# Patient Record
Sex: Female | Born: 1935 | Race: White | Hispanic: No | Marital: Single | Smoking: Never smoker
Health system: Southern US, Community
[De-identification: ages and names within clinical notes are randomized; demographics above are authoritative.]

## PROBLEM LIST (undated history)

## (undated) DIAGNOSIS — G809 Cerebral palsy, unspecified: Secondary | ICD-10-CM

## (undated) DIAGNOSIS — I82409 Acute embolism and thrombosis of unspecified deep veins of unspecified lower extremity: Secondary | ICD-10-CM

## (undated) DIAGNOSIS — G40909 Epilepsy, unspecified, not intractable, without status epilepticus: Secondary | ICD-10-CM

## (undated) DIAGNOSIS — C73 Malignant neoplasm of thyroid gland: Secondary | ICD-10-CM

## (undated) HISTORY — PX: OTHER SURGICAL HISTORY: SHX169

## (undated) HISTORY — PX: THYROIDECTOMY: SHX17

---

## 2003-03-10 ENCOUNTER — Emergency Department (HOSPITAL_COMMUNITY): Admission: EM | Admit: 2003-03-10 | Discharge: 2003-03-11 | Payer: Self-pay | Admitting: Emergency Medicine

## 2011-08-06 ENCOUNTER — Emergency Department (INDEPENDENT_AMBULATORY_CARE_PROVIDER_SITE_OTHER): Payer: Medicare Other

## 2011-08-06 ENCOUNTER — Other Ambulatory Visit (HOSPITAL_BASED_OUTPATIENT_CLINIC_OR_DEPARTMENT_OTHER): Payer: Self-pay

## 2011-08-06 ENCOUNTER — Emergency Department (HOSPITAL_BASED_OUTPATIENT_CLINIC_OR_DEPARTMENT_OTHER)
Admission: EM | Admit: 2011-08-06 | Discharge: 2011-08-06 | Disposition: A | Discharge status: Home / Self Care | Payer: Medicare Other | Attending: Emergency Medicine | Admitting: Emergency Medicine

## 2011-08-06 ENCOUNTER — Encounter (HOSPITAL_BASED_OUTPATIENT_CLINIC_OR_DEPARTMENT_OTHER): Payer: Self-pay | Admitting: *Deleted

## 2011-08-06 DIAGNOSIS — G809 Cerebral palsy, unspecified: Secondary | ICD-10-CM | POA: Insufficient documentation

## 2011-08-06 DIAGNOSIS — M542 Cervicalgia: Secondary | ICD-10-CM

## 2011-08-06 DIAGNOSIS — Z86718 Personal history of other venous thrombosis and embolism: Secondary | ICD-10-CM | POA: Insufficient documentation

## 2011-08-06 DIAGNOSIS — W19XXXA Unspecified fall, initial encounter: Secondary | ICD-10-CM

## 2011-08-06 DIAGNOSIS — G40909 Epilepsy, unspecified, not intractable, without status epilepticus: Secondary | ICD-10-CM | POA: Insufficient documentation

## 2011-08-06 DIAGNOSIS — S0100XA Unspecified open wound of scalp, initial encounter: Secondary | ICD-10-CM | POA: Insufficient documentation

## 2011-08-06 DIAGNOSIS — S0101XA Laceration without foreign body of scalp, initial encounter: Secondary | ICD-10-CM

## 2011-08-06 DIAGNOSIS — W010XXA Fall on same level from slipping, tripping and stumbling without subsequent striking against object, initial encounter: Secondary | ICD-10-CM | POA: Insufficient documentation

## 2011-08-06 DIAGNOSIS — R51 Headache: Secondary | ICD-10-CM

## 2011-08-06 DIAGNOSIS — Z85850 Personal history of malignant neoplasm of thyroid: Secondary | ICD-10-CM | POA: Insufficient documentation

## 2011-08-06 DIAGNOSIS — S0990XA Unspecified injury of head, initial encounter: Secondary | ICD-10-CM

## 2011-08-06 HISTORY — DX: Epilepsy, unspecified, not intractable, without status epilepticus: G40.909

## 2011-08-06 HISTORY — DX: Cerebral palsy, unspecified: G80.9

## 2011-08-06 HISTORY — DX: Acute embolism and thrombosis of unspecified deep veins of unspecified lower extremity: I82.409

## 2011-08-06 HISTORY — DX: Malignant neoplasm of thyroid gland: C73

## 2011-08-06 NOTE — ED Provider Notes (Signed)
Medical screening examination/treatment/procedure(s) were conducted as a shared visit with non-physician practitioner(s) and myself.  I personally evaluated the patient during the encounter  See my prior note.  Nat Christen, MD 08/06/11 [phone]

## 2011-08-06 NOTE — ED Notes (Signed)
Pt brought in by family members. States patient was standing up, using her walker, when she lost her balance and fell backwards, striking her head against tiled floor. Pt has laceration to left scalp. Bleeding at this time. Pt is on blood thinners. No loss of consciousness. Pt alert and oriented upon arrival.

## 2011-08-06 NOTE — Discharge Instructions (Signed)
Cervical Sprain A cervical sprain is an injury in the neck in which the ligaments are stretched or torn. The ligaments are the tissues that hold the bones of the neck (vertebrae) in place.Cervical sprains can range from very mild to very severe. Most cervical sprains get better in 1 to 3 weeks, but it depends on the cause and extent of the injury. Severe cervical sprains can cause the neck vertebrae to be unstable. This can lead to damage of the spinal cord and can result in serious nervous system problems. Your caregiver will determine whether your cervical sprain is mild or severe. CAUSES  Severe cervical sprains may be caused by:  Contact sport injuries (football, rugby, wrestling, hockey, auto racing, gymnastics, diving, martial arts, boxing).   Motor vehicle collisions.   Whiplash injuries. This means the neck is forcefully whipped backward and forward.   Falls.  Mild cervical sprains may be caused by:   Awkward positions, such as cradling a telephone between your ear and shoulder.   Sitting in a chair that does not offer proper support.   Working at a poorly designed computer station.   Activities that require looking up or down for long periods of time.  SYMPTOMS   Pain, soreness, stiffness, or a burning sensation in the front, back, or sides of the neck. This discomfort may develop immediately after injury or it may develop slowly and not begin for 24 hours or more after an injury.   Pain or tenderness directly in the middle of the back of the neck.   Shoulder or upper back pain.   Limited ability to move the neck.   Headache.   Dizziness.   Weakness, numbness, or tingling in the hands or arms.   Muscle spasms.   Difficulty swallowing or chewing.   Tenderness and swelling of the neck.  DIAGNOSIS  Most of the time, your caregiver can diagnose this problem by taking your history and doing a physical exam. Your caregiver will ask about any known problems, such as  arthritis in the neck or a previous neck injury. X-rays may be taken to find out if there are any other problems, such as problems with the bones of the neck. However, an X-ray often does not reveal the full extent of a cervical sprain. Other tests such as a computed tomography (CT) scan or magnetic resonance imaging (MRI) may be needed. TREATMENT  Treatment depends on the severity of the cervical sprain. Mild sprains can be treated with rest, keeping the neck in place (immobilization), and pain medicines. Severe cervical sprains need immediate immobilization and an appointment with an orthopedist or neurosurgeon. Several treatment options are available to help with pain, muscle spasms, and other symptoms. Your caregiver may prescribe:  Medicines, such as pain relievers, numbing medicines, or muscle relaxants.   Physical therapy. This can include stretching exercises, strengthening exercises, and posture training. Exercises and improved posture can help stabilize the neck, strengthen muscles, and help stop symptoms from returning.   A neck collar to be worn for short periods of time. Often, these collars are worn for comfort. However, certain collars may be worn to protect the neck and prevent further worsening of a serious cervical sprain.  HOME CARE INSTRUCTIONS   Put ice on the injured area.   Put ice in a plastic bag.   Place a towel between your skin and the bag.   Leave the ice on for 15 to 20 minutes, 3 to 4 times a day.     Only take over-the-counter or prescription medicines for pain, discomfort, or fever as directed by your caregiver.   Keep all follow-up appointments as directed by your caregiver.   Keep all physical therapy appointments as directed by your caregiver.   If a neck collar is prescribed, wear it as directed by your caregiver.   Do not drive while wearing a neck collar.   Make any needed adjustments to your work station to promote good posture.   Avoid positions  and activities that make your symptoms worse.   Warm up and stretch before being active to help prevent problems.  SEEK MEDICAL CARE IF:   Your pain is not controlled with medicine.   You are unable to decrease your pain medicine over time as planned.   Your activity level is not improving as expected.  SEEK IMMEDIATE MEDICAL CARE IF:   You develop any bleeding, stomach upset, or signs of an allergic reaction to your medicine.   Your symptoms get worse.   You develop new, unexplained symptoms.   You have numbness, tingling, weakness, or paralysis in any part of your body.  MAKE SURE YOU:   Understand these instructions.   Will watch your condition.   Will get help right away if you are not doing well or get worse.  Document Released: 02/15/2007 Document Revised: 04/09/2011 Document Reviewed: 01/21/2011 [facility] Patient Information 2012 [place], [place].Head Injury, Adult A head injury happens when the head is hit really hard. A head injury may cause sleepiness, headache, throwing up (vomiting), and problems seeing. If the head injury is really bad, you may need to stay in the hospital. HOME CARE  Have someone with you for the first 24 hours. This person should wake you up every 1 hour to check on your condition.   Only drink water or clear fluid for the rest of the day. Then, go back to your regular diet.   Only take medicines as told by your doctor. Do not take aspirin.   Do not drink alcohol for 2 days.   Do not take medicines that help your relax (sedatives) for 2 days.  Side effects may happen for up to 7 to 10 days. Watch for new problems. GET HELP RIGHT AWAY IF:   You are confused or sleepy.   You cannot be woken up.   You feel sick to your stomach (nauseous) or keep throwing up.   Your dizziness or unsteadiness is get worse, or your cannot walk.   You start to shake (convulse) or pass out (faint).   You have very bad, lasting headaches that are not helped by  medicine.   You cannot use your arms or legs like normal.   You have clear or bloody fluid coming from your nose or ears.  MAKE SURE YOU:   Understand these instructions.   Will watch your condition.   Will get help right away if you are not doing well or get worse.  Document Released: 04/02/2008 Document Revised: 04/09/2011 Document Reviewed: 03/06/2009 [facility] Patient Information 2012 [place], [place].Laceration Care, Adult A laceration is a cut or lesion that goes through all layers of the skin and into the tissue just beneath the skin. TREATMENT  Some lacerations may not require closure. Some lacerations may not be able to be closed due to an increased risk of infection. It is important to see your caregiver as soon as possible after an injury to minimize the risk of infection and maximize the opportunity for successful closure. If  closure is appropriate, pain medicines may be given, if needed. The wound will be cleaned to help prevent infection. Your caregiver will use stitches (sutures), staples, wound glue (adhesive), or skin adhesive strips to repair the laceration. These tools bring the skin edges together to allow for faster healing and a better cosmetic outcome. However, all wounds will heal with a scar. Once the wound has healed, scarring can be minimized by covering the wound with sunscreen during the day for 1 full year. HOME CARE INSTRUCTIONS  For sutures or staples:  Keep the wound clean and dry.   If you were given a bandage (dressing), you should change it at least once a day. Also, change the dressing if it becomes wet or dirty, or as directed by your caregiver.   Wash the wound with soap and water 2 times a day. Rinse the wound off with water to remove all soap. Pat the wound dry with a clean towel.   After cleaning, apply a thin layer of the antibiotic ointment as recommended by your caregiver. This will help prevent infection and keep the dressing from sticking.    You may shower as usual after the first 24 hours. Do not soak the wound in water until the sutures are removed.   Only take over-the-counter or prescription medicines for pain, discomfort, or fever as directed by your caregiver.   Get your sutures or staples removed as directed by your caregiver.  For skin adhesive strips:  Keep the wound clean and dry.   Do not get the skin adhesive strips wet. You may bathe carefully, using caution to keep the wound dry.   If the wound gets wet, pat it dry with a clean towel.   Skin adhesive strips will fall off on their own. You may trim the strips as the wound heals. Do not remove skin adhesive strips that are still stuck to the wound. They will fall off in time.  For wound adhesive:  You may briefly wet your wound in the shower or bath. Do not soak or scrub the wound. Do not swim. Avoid periods of heavy perspiration until the skin adhesive has fallen off on its own. After showering or bathing, gently pat the wound dry with a clean towel.   Do not apply liquid medicine, cream medicine, or ointment medicine to your wound while the skin adhesive is in place. This may loosen the film before your wound is healed.   If a dressing is placed over the wound, be careful not to apply tape directly over the skin adhesive. This may cause the adhesive to be pulled off before the wound is healed.   Avoid prolonged exposure to sunlight or tanning lamps while the skin adhesive is in place. Exposure to ultraviolet light in the first year will darken the scar.   The skin adhesive will usually remain in place for 5 to 10 days, then naturally fall off the skin. Do not pick at the adhesive film.  You may need a tetanus shot if:  You cannot remember when you had your last tetanus shot.   You have never had a tetanus shot.  If you get a tetanus shot, your arm may swell, get red, and feel warm to the touch. This is common and not a problem. If you need a tetanus shot  and you choose not to have one, there is a rare chance of getting tetanus. Sickness from tetanus can be serious. SEEK MEDICAL CARE IF:  You have redness, swelling, or increasing pain in the wound.   You see a red line that goes away from the wound.   You have yellowish-white fluid (pus) coming from the wound.   You have a fever.   You notice a bad smell coming from the wound or dressing.   Your wound breaks open before or after sutures have been removed.   You notice something coming out of the wound such as wood or glass.   Your wound is on your hand or foot and you cannot move a finger or toe.  SEEK IMMEDIATE MEDICAL CARE IF:   Your pain is not controlled with prescribed medicine.   You have severe swelling around the wound causing pain and numbness or a change in color in your arm, hand, leg, or foot.   Your wound splits open and starts bleeding.   You have worsening numbness, weakness, or loss of function of any joint around or beyond the wound.   You develop painful lumps near the wound or on the skin anywhere on your body.  MAKE SURE YOU:   Understand these instructions.   Will watch your condition.   Will get help right away if you are not doing well or get worse.  Document Released: 04/20/2005 Document Revised: 04/09/2011 Document Reviewed: 10/14/2010 [facility] Patient Information 2012 [place], [place].Stitches, Staples, or Skin Adhesive Strips  Stitches (sutures), staples, and skin adhesive strips hold the skin together as it heals. They will usually be in place for 7 days or less. HOME CARE  Wash your hands with soap and water before and after you touch your wound.   Only take medicine as told by your doctor.   Cover your wound only if your doctor told you to. Otherwise, leave it open to air.   Do not get your stitches wet or dirty. If they get dirty, dab them gently with a clean washcloth. Wet the washcloth with soapy water. Do not rub. Pat them dry gently.    Do not put medicine or medicated cream on your stitches unless your doctor told you to.   Do not take out your own stitches or staples. Skin adhesive strips will fall off by themselves.   Do not pick at the wound. Picking can cause an infection.   Do not miss your follow-up appointment.   If you have problems or questions, call your doctor.  GET HELP RIGHT AWAY IF:   You have a temperature by mouth above 102 F (38.9 C), not controlled by medicine.   You have chills.   You have redness or pain around your stitches.   There is puffiness (swelling) around your stitches.   You notice fluid (drainage) from your stitches.   There is a bad smell coming from your wound.  MAKE SURE YOU:  Understand these instructions.   Will watch your condition.   Will get help if you are not doing well or get worse.  Document Released: 02/15/2009 Document Revised: 04/09/2011 Document Reviewed: 02/15/2009 [facility] Patient Information 2012 [place], [place].

## 2011-08-06 NOTE — ED Provider Notes (Signed)
History     CSN: [id number]  Arrival date & time 08/06/11  1941   First MD Initiated Contact with Patient 08/06/11 1956      Chief Complaint  Patient presents with  . Head Injury    (Consider location/radiation/quality/duration/timing/severity/associated sxs/prior treatment) HPI Comments: Caregiver states that pt fell backwards after losing her balance walking with her walker:pt states that she is having pain in the left side of her head where the laceration is and in her neck:pt is on a blood thinner:pt has a history of falls with cp:no loc with fall:per caregiver pt is at baseline:pt got up and was able to walk with walker after the fall  The history is provided by the patient and a caregiver. No language interpreter was used.    Past Medical History  Diagnosis Date  . Cerebral palsy   . Thyroid cancer   . DVT (deep venous thrombosis)   . Epilepsy     Past Surgical History  Procedure Date  . Anterior cervical diskectomy   . Thyroidectomy     No family history on file.  History  Substance Use Topics  . Smoking status: Never Smoker   . Smokeless tobacco: Not on file  . Alcohol Use: No    OB History    Grav Para Term Preterm Abortions TAB SAB Ect Mult Living                  Review of Systems  Unable to perform ROS: Other  Respiratory: Negative.   Cardiovascular: Negative.     Allergies  Review of patient's allergies indicates not on file.  Home Medications  No current outpatient prescriptions on file.  BP 197/81  Pulse 79  Temp(Src) 97.4 F (36.3 C) (Oral)  Resp 18  Ht 5\' 3"  (1.6 m)  Wt 160 lb (72.576 kg)  BMI 28.34 kg/m2  SpO2 100%  Physical Exam  Nursing note and vitals reviewed. Constitutional: She is oriented to person, place, and time. She appears well-nourished.  HENT:       Left scalp laceration  Eyes: Conjunctivae and EOM are normal. Pupils are equal, round, and reactive to light.  Neck: Neck supple.  Cardiovascular: Normal rate and  regular rhythm.   Pulmonary/Chest: Effort normal and breath sounds normal.  Musculoskeletal: Normal range of motion.       Cervical back: She exhibits bony tenderness.       Thoracic back: She exhibits no bony tenderness.       Lumbar back: She exhibits no bony tenderness.  Neurological: She is alert and oriented to person, place, and time.  Skin:       Pt has a laceration to the left scalp  Psychiatric: She has a normal mood and affect.    ED Course  LACERATION REPAIR Performed by: Teressa Lower Authorized by: Teressa Lower Consent given by: patient Patient identity confirmed: arm band Time out: Immediately prior to procedure a "time out" was called to verify the correct patient, procedure, equipment, support staff and site/side marked as required. Body area: head/neck Location details: scalp Laceration length: 3 cm Foreign bodies: no foreign bodies Anesthesia: local infiltration Local anesthetic: lidocaine 2% without epinephrine Irrigation solution: saline Irrigation method: syringe Amount of cleaning: standard Skin closure: staples Technique: simple Approximation difficulty: simple Patient tolerance: Patient tolerated the procedure well with no immediate complications. Comments: 6 staples placed   (including critical care time)  Labs Reviewed - No data to display Ct Head Wo Contrast  08/06/2011  *  RADIOLOGY REPORT*  Clinical Data:  Fall  CT HEAD WITHOUT CONTRAST CT CERVICAL SPINE WITHOUT CONTRAST  Technique:  Multidetector CT imaging of the head and cervical spine was performed following the standard protocol without intravenous contrast.  Multiplanar CT image reconstructions of the cervical spine were also generated.  Comparison:   None  CT HEAD  Findings: Chronic ischemic changes.  No mass effect, midline shift, or acute intracranial hemorrhage.  Soft tissue hematoma over the left parietal region is noted.  No skull fracture.  IMPRESSION: No acute intracranial  pathology.  CT CERVICAL SPINE  Findings: No acute fracture and no dislocation.  Anterolisthesis C4 upon C5 as well as at C3 upon C4 is degenerative.  Multilevel degenerative changes present. Spinal stenosis is evident at C6-7. There is fusion of the bilateral C2-3 facet joints.  Anterior plate and screws is present at C5, C6, and C7.  There is solid beyond the fusion from C5-C6.    The right C5 screw as well as the left C6 screw are suspected to be fractured.  Status post thyroidectomy.  IMPRESSION: No acute bony injury in the cervical spine.  Postoperative and degenerative changes are noted.  Original Report Authenticated By: Donavan Burnet, M.D.   Ct Cervical Spine Wo Contrast  08/06/2011  *RADIOLOGY REPORT*  Clinical Data:  Fall  CT HEAD WITHOUT CONTRAST CT CERVICAL SPINE WITHOUT CONTRAST  Technique:  Multidetector CT imaging of the head and cervical spine was performed following the standard protocol without intravenous contrast.  Multiplanar CT image reconstructions of the cervical spine were also generated.  Comparison:   None  CT HEAD  Findings: Chronic ischemic changes.  No mass effect, midline shift, or acute intracranial hemorrhage.  Soft tissue hematoma over the left parietal region is noted.  No skull fracture.  IMPRESSION: No acute intracranial pathology.  CT CERVICAL SPINE  Findings: No acute fracture and no dislocation.  Anterolisthesis C4 upon C5 as well as at C3 upon C4 is degenerative.  Multilevel degenerative changes present. Spinal stenosis is evident at C6-7. There is fusion of the bilateral C2-3 facet joints.  Anterior plate and screws is present at C5, C6, and C7.  There is solid beyond the fusion from C5-C6.    The right C5 screw as well as the left C6 screw are suspected to be fractured.  Status post thyroidectomy.  IMPRESSION: No acute bony injury in the cervical spine.  Postoperative and degenerative changes are noted.  Original Report Authenticated By: Donavan Burnet, M.D.     1.  Fall   2. Head injury   3. Scalp laceration   4. Neck pain       MDM  No acute abnormality noted on imaging:pt is acting at baseline:pt is ambulating at baseline        Teressa Lower, NP 08/06/11 2059

## 2011-08-06 NOTE — ED Provider Notes (Signed)
Medical screening examination/treatment/procedure(s) were conducted as a shared visit with non-physician practitioner(s) and myself.  I personally evaluated the patient during the encounter  Patient with fall and laceration to scalp.  She is on Pradaxa at this time.  We'll plan to obtain CT scan of the patient's head to rule out intracranial bleed.  Patient is otherwise at baseline at this time and we'll plan to repair the laceration is well and his CT scan is normal and patient is at baseline should be able to be discharged home.  Nat Christen, MD 08/06/11 2005

## 2018-09-14 ENCOUNTER — Encounter (HOSPITAL_COMMUNITY): Payer: Self-pay | Admitting: Emergency Medicine

## 2018-09-14 ENCOUNTER — Emergency Department (HOSPITAL_COMMUNITY): Payer: Medicare Other

## 2018-09-14 ENCOUNTER — Emergency Department (HOSPITAL_COMMUNITY)
Admission: EM | Admit: 2018-09-14 | Discharge: 2018-09-15 | Disposition: A | Discharge status: Skilled Nursing Facility | Payer: Medicare Other | Attending: Emergency Medicine | Admitting: Emergency Medicine

## 2018-09-14 ENCOUNTER — Other Ambulatory Visit: Payer: Self-pay

## 2018-09-14 DIAGNOSIS — I160 Hypertensive urgency: Secondary | ICD-10-CM | POA: Diagnosis not present

## 2018-09-14 DIAGNOSIS — N390 Urinary tract infection, site not specified: Secondary | ICD-10-CM | POA: Insufficient documentation

## 2018-09-14 DIAGNOSIS — I10 Essential (primary) hypertension: Secondary | ICD-10-CM | POA: Insufficient documentation

## 2018-09-14 DIAGNOSIS — Z85850 Personal history of malignant neoplasm of thyroid: Secondary | ICD-10-CM | POA: Diagnosis not present

## 2018-09-14 DIAGNOSIS — Z79899 Other long term (current) drug therapy: Secondary | ICD-10-CM | POA: Insufficient documentation

## 2018-09-14 DIAGNOSIS — G809 Cerebral palsy, unspecified: Secondary | ICD-10-CM | POA: Insufficient documentation

## 2018-09-14 LAB — COMPREHENSIVE METABOLIC PANEL
ALT: 16 U/L (ref 0–44)
AST: 23 U/L (ref 15–41)
Albumin: 3.5 g/dL (ref 3.5–5.0)
Alkaline Phosphatase: 66 U/L (ref 38–126)
Anion gap: 8 (ref 5–15)
BUN: 24 mg/dL — ABNORMAL HIGH (ref 8–23)
CO2: 29 mmol/L (ref 22–32)
Calcium: 9 mg/dL (ref 8.9–10.3)
Chloride: 112 mmol/L — ABNORMAL HIGH (ref 98–111)
Creatinine, Ser: 0.77 mg/dL (ref 0.44–1.00)
GFR calc Af Amer: >60 mL/min (ref >60)
GFR calc non Af Amer: >60 mL/min (ref >60)
Glucose, Bld: 88 mg/dL (ref 70–99)
Potassium: 3.6 mmol/L (ref 3.5–5.1)
Sodium: 149 mmol/L — ABNORMAL HIGH (ref 135–145)
Total Bilirubin: 0.9 mg/dL (ref 0.3–1.2)
Total Protein: 7.4 g/dL (ref 6.5–8.1)

## 2018-09-14 LAB — CBC WITH DIFFERENTIAL/PLATELET
Abs Immature Granulocytes: 0.02 10*3/uL (ref 0.00–0.07)
Basophils Absolute: 0 10*3/uL (ref 0.0–0.1)
Basophils Relative: 1 %
Eosinophils Absolute: 0.1 10*3/uL (ref 0.0–0.5)
Eosinophils Relative: 1 %
HCT: 36.6 % (ref 36.0–46.0)
Hemoglobin: 11.7 g/dL — ABNORMAL LOW (ref 12.0–15.0)
Immature Granulocytes: 0 %
Lymphocytes Relative: 25 %
Lymphs Abs: 1.9 10*3/uL (ref 0.7–4.0)
MCH: 31.7 pg (ref 26.0–34.0)
MCHC: 32 g/dL (ref 30.0–36.0)
MCV: 99.2 fL (ref 80.0–100.0)
Monocytes Absolute: 0.7 10*3/uL (ref 0.1–1.0)
Monocytes Relative: 9 %
Neutro Abs: 4.9 10*3/uL (ref 1.7–7.7)
Neutrophils Relative %: 64 %
Platelets: 225 10*3/uL (ref 150–400)
RBC: 3.69 MIL/uL — ABNORMAL LOW (ref 3.87–5.11)
RDW: 14.2 % (ref 11.5–15.5)
WBC: 7.6 10*3/uL (ref 4.0–10.5)
nRBC: 0 % (ref 0.0–0.2)

## 2018-09-14 MED ORDER — LEVETIRACETAM 500 MG PO TABS
500.00 | ORAL_TABLET | ORAL | Status: DC
Start: 2018-09-13 — End: 2018-09-14

## 2018-09-14 MED ORDER — ACETAMINOPHEN 650 MG RE SUPP
650.00 | RECTAL | Status: DC
Start: ? — End: 2018-09-14

## 2018-09-14 MED ORDER — LISINOPRIL 20 MG PO TABS
40.00 | ORAL_TABLET | ORAL | Status: DC
Start: 2018-09-14 — End: 2018-09-14

## 2018-09-14 MED ORDER — LEVOTHYROXINE SODIUM 150 MCG PO TABS
150.00 | ORAL_TABLET | ORAL | Status: DC
Start: 2018-09-14 — End: 2018-09-14

## 2018-09-14 MED ORDER — ENOXAPARIN SODIUM 30 MG/0.3ML ~~LOC~~ SOLN
30.00 | SUBCUTANEOUS | Status: DC
Start: 2018-09-14 — End: 2018-09-14

## 2018-09-14 MED ORDER — SODIUM CHLORIDE FLUSH 0.9 % IV SOLN
5.00 | INTRAVENOUS | Status: DC
Start: ? — End: 2018-09-14

## 2018-09-14 MED ORDER — SODIUM CHLORIDE 0.9 % IV BOLUS
500.0000 mL | Freq: Once | INTRAVENOUS | Status: AC
  Administered 2018-09-14: 500 mL via INTRAVENOUS

## 2018-09-14 MED ORDER — HYDRALAZINE HCL 20 MG/ML IJ SOLN
10.0000 mg | Freq: Once | INTRAMUSCULAR | Status: AC
  Administered 2018-09-14: 10 mg via INTRAVENOUS
  Filled 2018-09-14: qty 1

## 2018-09-14 MED ORDER — HYDRALAZINE HCL 20 MG/ML IJ SOLN
5.00 | INTRAMUSCULAR | Status: DC
Start: ? — End: 2018-09-14

## 2018-09-14 MED ORDER — SODIUM CHLORIDE FLUSH 0.9 % IV SOLN
5.00 | INTRAVENOUS | Status: DC
Start: 2018-09-13 — End: 2018-09-14

## 2018-09-14 MED ORDER — GENERIC EXTERNAL MEDICATION
1.00 | Status: DC
Start: 2018-09-14 — End: 2018-09-14

## 2018-09-14 NOTE — ED Provider Notes (Signed)
[facility] DEPT Provider Note   CSN: [id number] Arrival date & time: 09/14/18  1935    History   Chief Complaint Chief Complaint  Patient presents with  . Hypertension    HPI Mikayla Novak is a 85 y.o. female.     HPI Patient with history of cerebral palsy and is unable to contribute to history.  Recently hospitalized at [facility] and discharged to nursing facility.  Per EMS staff states patient has been refusing oral medications. Past Medical History:  Diagnosis Date  . Cerebral palsy ([place])   . DVT (deep venous thrombosis) ([place])   . Epilepsy ([place])   . Thyroid cancer ([place])     There are no active problems to display for this patient.   Past Surgical History:  Procedure Laterality Date  . anterior cervical diskectomy    . THYROIDECTOMY       OB History   No obstetric history on file.      Home Medications    Prior to Admission medications   Medication Sig Start Date End Date Taking? Authorizing Provider  hydrALAZINE (APRESOLINE) 20 MG/ML injection Inject 10 mg into the vein every 6 (six) hours as needed.   Yes [provider]  levETIRAcetam (KEPPRA) 100 MG/ML solution Take 5 mLs by mouth every 12 (twelve) hours. 8 AM and 8 PM   Yes [provider]  levothyroxine (SYNTHROID) 150 MCG tablet Take 150 mcg by mouth daily before breakfast.   Yes [provider]  loratadine (CLARITIN) 10 MG tablet Take 10 mg by mouth daily.   Yes [provider]  Multiple Vitamin (MULTIVITAMIN WITH MINERALS) TABS tablet Take 1 tablet by mouth daily.   Yes [provider]  Nutritional Supplements (NUTRITIONAL DRINK) LIQD Take 1 each by mouth 3 (three) times daily.   Yes [provider]  nystatin (NYSTATIN) powder Apply 1 g topically 2 (two) times daily. To periarea/ groin   Yes [provider]  polyvinyl alcohol (LIQUIFILM TEARS) 1.4 % ophthalmic solution Place 1 drop into both eyes 3  (three) times daily.   Yes [provider]  Vitamin D, Ergocalciferol, (DRISDOL) 1.25 MG (50000 UT) CAPS capsule Take 50,000 Units by mouth every 7 (seven) days.   Yes [provider]  cephALEXin (KEFLEX) 500 MG capsule Take 1 capsule (500 mg total) by mouth 2 (two) times daily. 09/15/18   Veryl Speak, MD    Family History History reviewed. No pertinent family history.  Social History Social History   Tobacco Use  . Smoking status: Never Smoker  Substance Use Topics  . Alcohol use: No  . Drug use: No     Allergies   Amoxicillin   Review of Systems Review of Systems  Unable to perform ROS: Patient nonverbal     Physical Exam Updated Vital Signs BP (!) 160/63   Pulse 72   Temp 98.3 F (36.8 C) (Oral)   Resp (!) 23   SpO2 95%   Physical Exam Vitals signs and nursing note reviewed.  Constitutional:      Appearance: She is well-developed.     Comments: Chronically ill-appearing  HENT:     Head: Normocephalic and atraumatic.     Mouth/Throat:     Mouth: Mucous membranes are dry.  Eyes:     Pupils: Pupils are equal, round, and reactive to light.  Neck:     Musculoskeletal: Normal range of motion and neck supple. No neck rigidity or muscular tenderness.  Cardiovascular:     Rate and Rhythm: Normal rate and regular rhythm.  Pulmonary:     Effort: Pulmonary effort is normal. No respiratory distress.     Breath sounds: Normal breath sounds. No stridor. No wheezing, rhonchi or rales.  Chest:     Chest wall: No tenderness.  Abdominal:     General: Bowel sounds are normal.     Palpations: Abdomen is soft.     Tenderness: There is no abdominal tenderness. There is no guarding or rebound.  Musculoskeletal: Normal range of motion.        General: No tenderness.  Lymphadenopathy:     Cervical: No cervical adenopathy.  Skin:    General: Skin is warm and dry.     Capillary Refill: Capillary refill takes less than 2 seconds.     Findings: No erythema  or rash.  Neurological:     Mental Status: She is alert.     Comments: Patient is nonverbal.  Will follow simple commands.  Generally weak but appears to be moving all extremities.  Psychiatric:        Behavior: Behavior normal.      ED Treatments / Results  Labs (all labs ordered are listed, but only abnormal results are displayed) Labs Reviewed  CBC WITH DIFFERENTIAL/PLATELET - Abnormal; Notable for the following components:      Result Value   RBC 3.69 (*)    Hemoglobin 11.7 (*)    All other components within normal limits  COMPREHENSIVE METABOLIC PANEL - Abnormal; Notable for the following components:   Sodium 149 (*)    Chloride 112 (*)    BUN 24 (*)    All other components within normal limits  URINALYSIS, ROUTINE W REFLEX MICROSCOPIC - Abnormal; Notable for the following components:   APPearance HAZY (*)    Hgb urine dipstick SMALL (*)    Ketones, ur 20 (*)    Protein, ur 30 (*)    Leukocytes,Ua LARGE (*)    WBC, UA >50 (*)    Bacteria, UA RARE (*)    All other components within normal limits    EKG None  Radiology No results found.  Procedures Procedures (including critical care time)  Medications Ordered in ED Medications  sodium chloride 0.9 % bolus 500 mL (0 mLs Intravenous Stopped 09/15/18 0000)  hydrALAZINE (APRESOLINE) injection 10 mg (10 mg Intravenous Given 09/14/18 2233)  cefTRIAXone (ROCEPHIN) 1 g in sodium chloride 0.9 % 100 mL IVPB (0 g Intravenous Stopped 09/15/18 0450)     Initial Impression / Assessment and Plan / ED Course  I have reviewed the triage vital signs and the nursing notes.  Pertinent labs & imaging results that were available during my care of the patient were reviewed by me and considered in my medical decision making (see chart for details).       Signed out to oncoming emergency provider pending UA.  Given dose of IV hydralazine.   Final Clinical Impressions(s) / ED Diagnoses   Final diagnoses:  Hypertensive urgency   Urinary tract infection without hematuria, site unspecified    ED Discharge Orders         Ordered    cephALEXin (KEFLEX) 500 MG capsule  2 times daily     09/15/18 0340           Julianne Rice, MD 09/17/18 [phone]

## 2018-09-14 NOTE — ED Notes (Signed)
Pt has not provided urine specimen at this time.

## 2018-09-14 NOTE — ED Triage Notes (Signed)
Patient is from [facility]. She was recently hospitalized for hypertension at [facility] and was discharged 2 days ago. Since discharge she has not taken any medications including those for hypertension and seizure. She has been refusing everything PO.   EMS vitals:  198/100 BP 97.7 Temp 105 CBG 97% O2 sats on 2L O2 68 HR

## 2018-09-14 NOTE — ED Notes (Signed)
Bed: MV36 Expected date:  Expected time:  Means of arrival:  Comments: EMS :  HTN

## 2018-09-14 NOTE — ED Notes (Signed)
Pt transported to CT ?

## 2018-09-14 NOTE — ED Notes (Addendum)
Pt placed on purewick. Will obtain urine specimen once pt voids.

## 2018-09-14 NOTE — ED Notes (Signed)
Patient transported to CT 

## 2018-09-15 DIAGNOSIS — I160 Hypertensive urgency: Secondary | ICD-10-CM | POA: Diagnosis not present

## 2018-09-15 LAB — URINALYSIS, ROUTINE W REFLEX MICROSCOPIC
Bilirubin Urine: NEGATIVE
Glucose, UA: NEGATIVE mg/dL
Ketones, ur: 20 mg/dL — AB
Nitrite: NEGATIVE
Protein, ur: 30 mg/dL — AB
Specific Gravity, Urine: 1.017 (ref 1.005–1.030)
WBC, UA: >50 WBC/hpf — ABNORMAL HIGH (ref 0–5)
pH: 5 (ref 5.0–8.0)

## 2018-09-15 MED ORDER — CEPHALEXIN 500 MG PO CAPS: 500.0000 mg | ORAL_CAPSULE | Freq: Two times a day (BID) | ORAL | 0 refills | Status: AC

## 2018-09-15 MED ORDER — SODIUM CHLORIDE 0.9 % IV SOLN
1.0000 g | Freq: Once | INTRAVENOUS | Status: AC
  Administered 2018-09-15: 1 g via INTRAVENOUS
  Filled 2018-09-15: qty 10

## 2018-09-15 NOTE — ED Provider Notes (Signed)
Care assumed from Dr. Lita Mains at shift change.  Patient awaiting urinalysis.  Patient initially sent here from her extended care facility over concerns of her blood pressure and refusing to take her medications.  Patient was given medications here in the ER and appears stable.  Her blood pressure was initially elevated, but improved with hydralazine.  I assumed care awaiting a urinalysis.  This was performed and was consistent with a UTI.  This will be treated with IV Rocephin and Keflex.  I see nothing here that warrants admission.   Veryl Speak, MD 09/15/18 [phone]

## 2018-09-15 NOTE — ED Notes (Signed)
Pt has still not voided at this time

## 2018-09-15 NOTE — ED Notes (Signed)
Upon cleaning pt before in and out cath, pt noted to have a lot of white discharge. Pt provided peri care before and after in and out cath.

## 2018-09-15 NOTE — ED Notes (Signed)
Pt resting in bed. Updated that were waiting for urine specimen. NAD

## 2018-09-15 NOTE — ED Notes (Signed)
Pt has not voided for urine specimen at this time

## 2018-09-15 NOTE — ED Notes (Signed)
Report called to [facility] and instructions given to RN. Verbalized understanding and will return pt if s/s worsen.

## 2018-09-15 NOTE — ED Notes (Signed)
[facility] at bedside to take pt home. All documentation, including Rx and DNR given to transport. D/c instructions given and sent with pt. Understanding of instructions verbalized. Told to return if s/s worsen. No further distress or questions upon leaving unit.

## 2018-09-15 NOTE — ED Notes (Signed)
Writer asked EDP [place] if purewick would be ok for specimen collection of urine, EDP confirmed that was ok

## 2018-09-15 NOTE — ED Notes (Signed)
[facility] called for transport home. 

## 2018-09-15 NOTE — Discharge Instructions (Signed)
Begin taking Keflex as prescribed.  Continue other medications as previously prescribed.  Follow-up with primary doctor in the next week.

## 2018-12-03 DEATH — deceased

## 2020-05-26 IMAGING — CT CT HEAD WITHOUT CONTRAST
3 series · 16 of 46 positions shown, 19 images · non-contrast
Comparison: Head CT scan 02/25/2018 and 09/05/2018.

CLINICAL DATA: Status post seizure.  History of hypertension.

EXAM:
CT HEAD WITHOUT CONTRAST
TECHNIQUE: Contiguous axial images were obtained from the base of the skull
through the vertex without intravenous contrast.

[Series 2: head wo · axial · 0.47mm/px · z∈[+1468,+1588]mm · 10 of 29 slices shown, 13 images]
[im 3/29  brain]
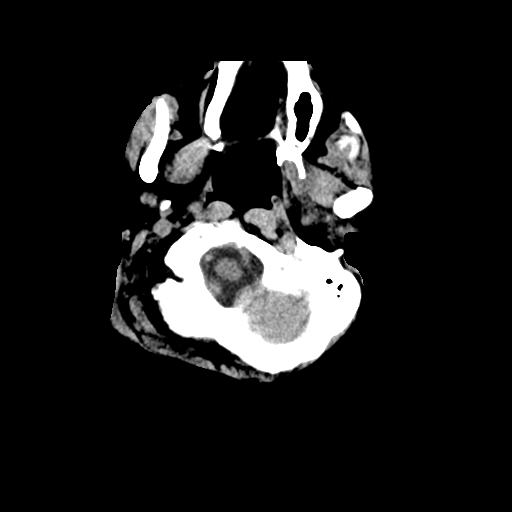
[im 3/29  bone]
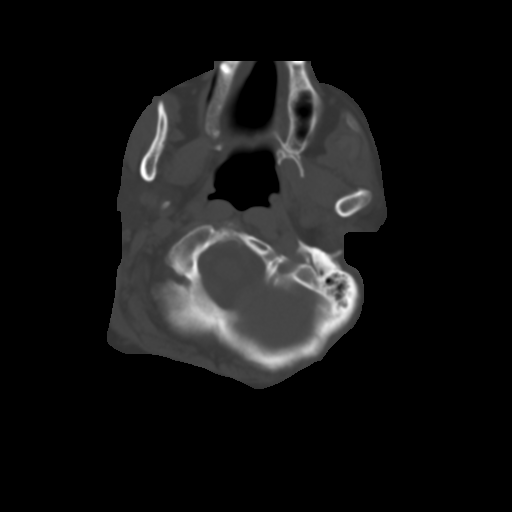
[im 6/29  brain]
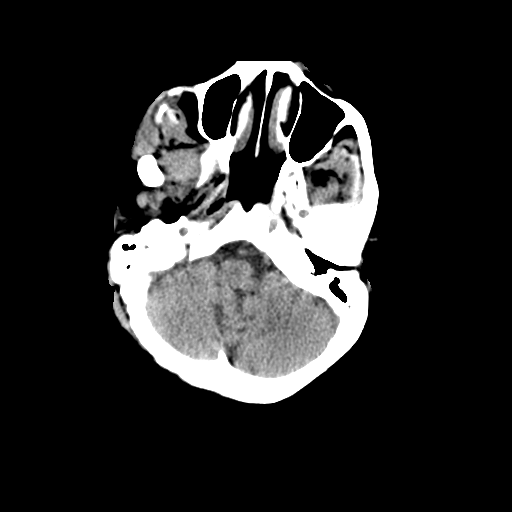
[im 8/29  brain]
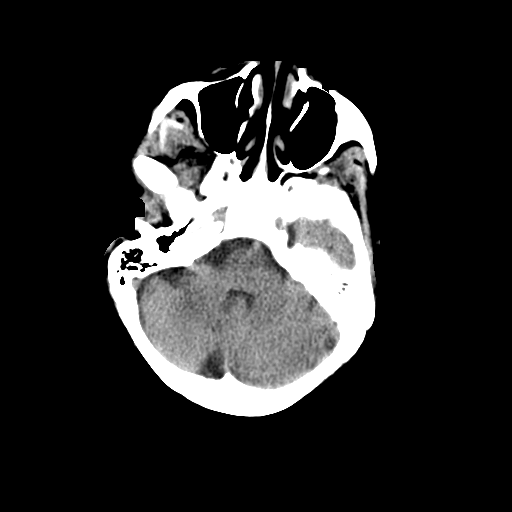
[im 11/29  brain]
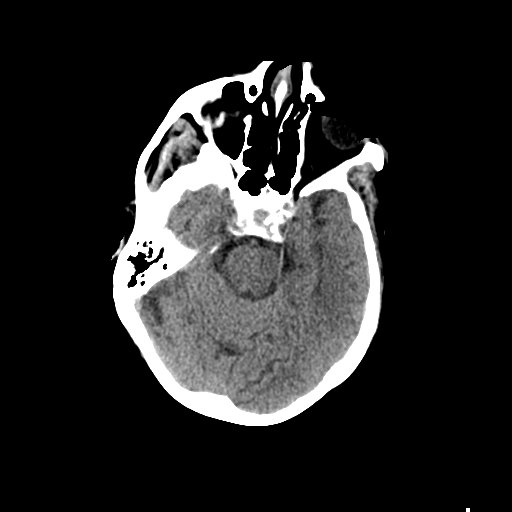
[im 14/29  brain]
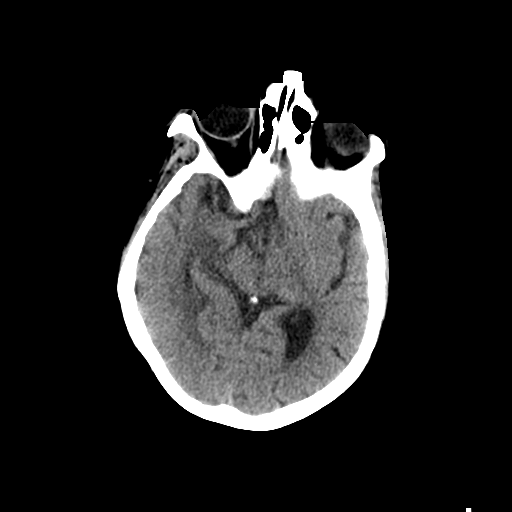
[im 14/29  bone]
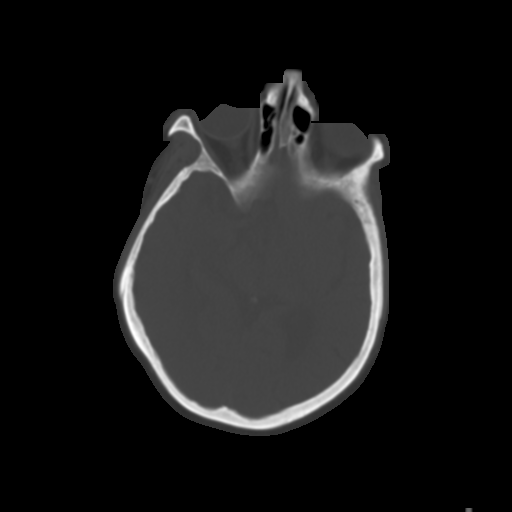
[im 16/29  brain]
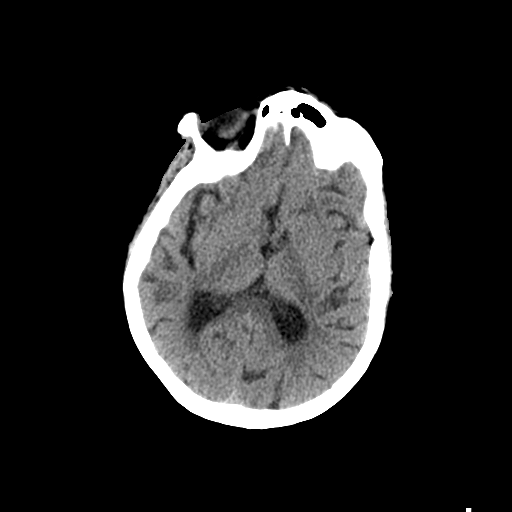
[im 19/29  brain]
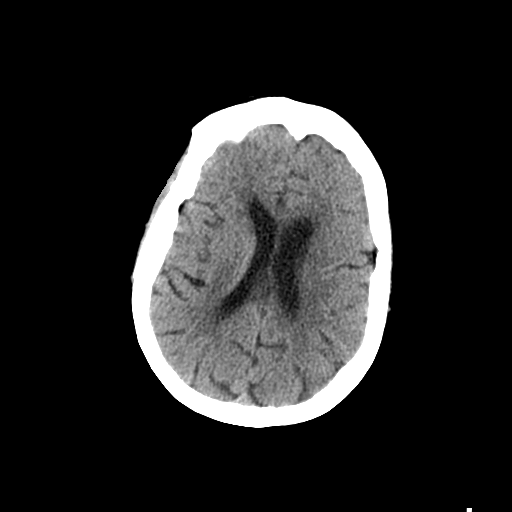
[im 22/29  brain]
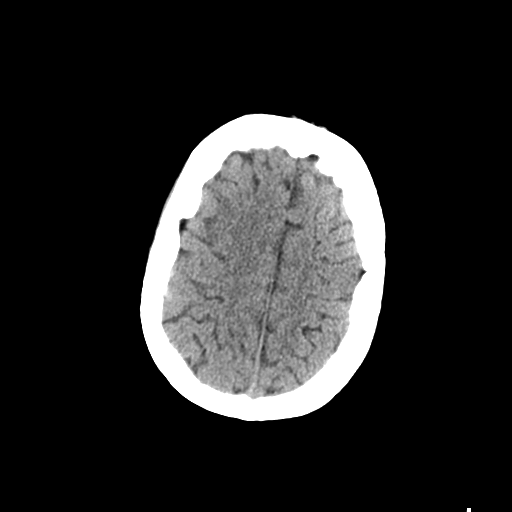
[im 24/29  brain]
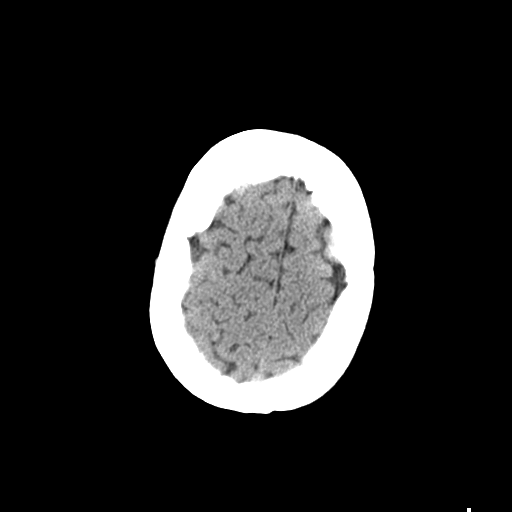
[im 24/29  bone]
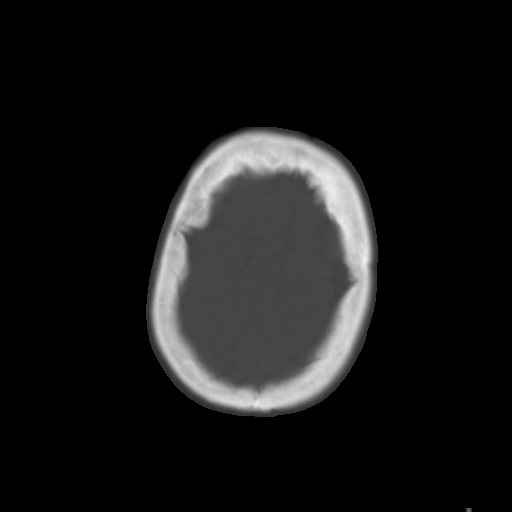
[im 27/29  brain]
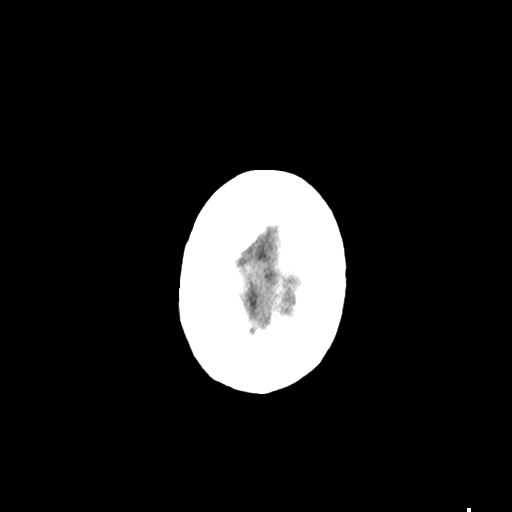

[Series 4: coronal soft tissue · coronal · 0.27mm/px · 3 of 58 slices shown]
[im 20/58  brain]
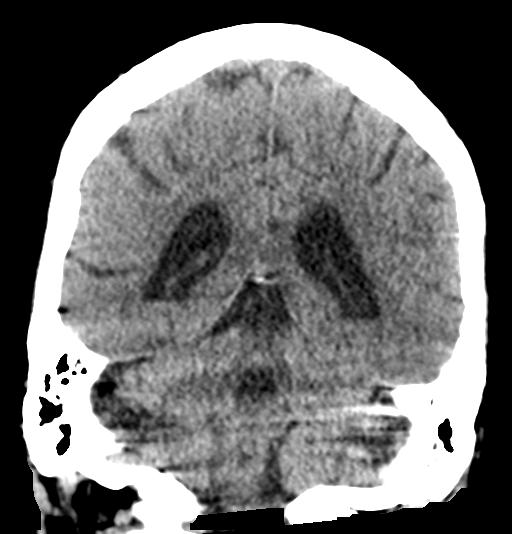
[im 26/58  brain]
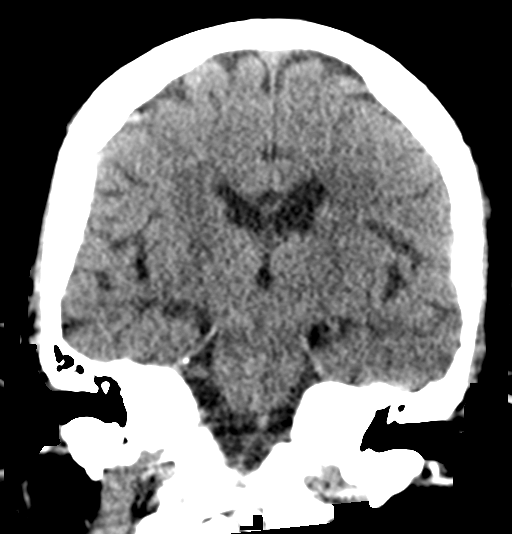
[im 32/58  brain]
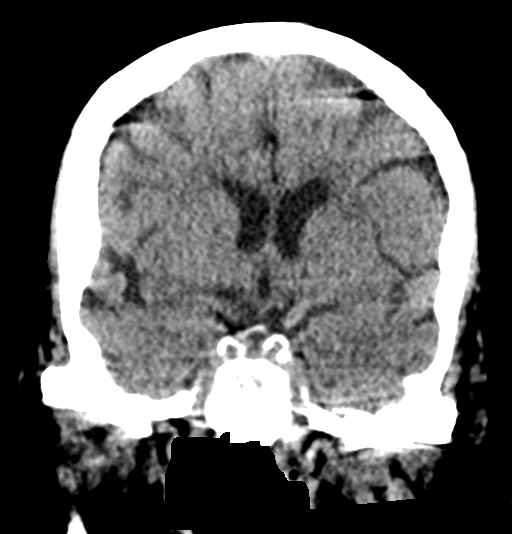

[Series 5: sagittal soft tissue · sagittal · 0.29mm/px · 3 of 44 slices shown]
[im 15/44  brain]
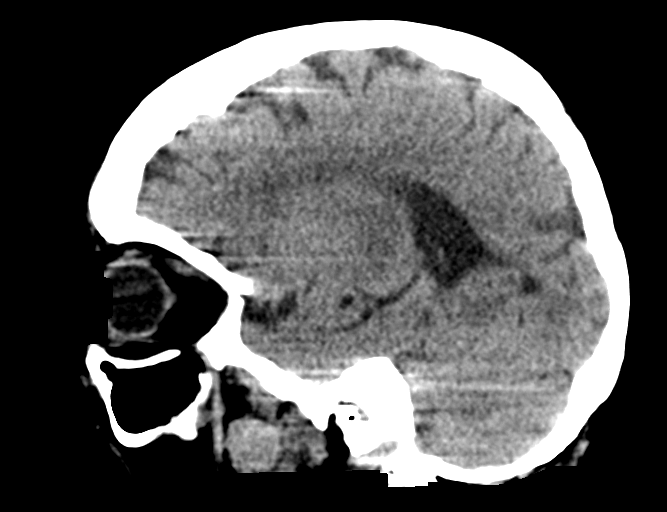
[im 22/44  brain]
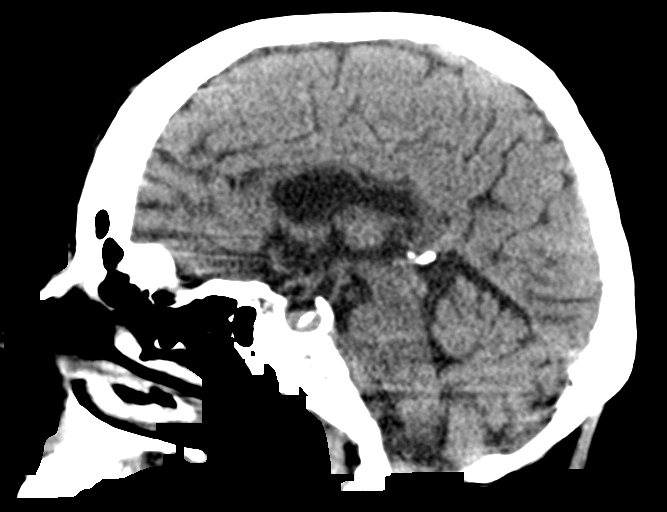
[im 29/44  brain]
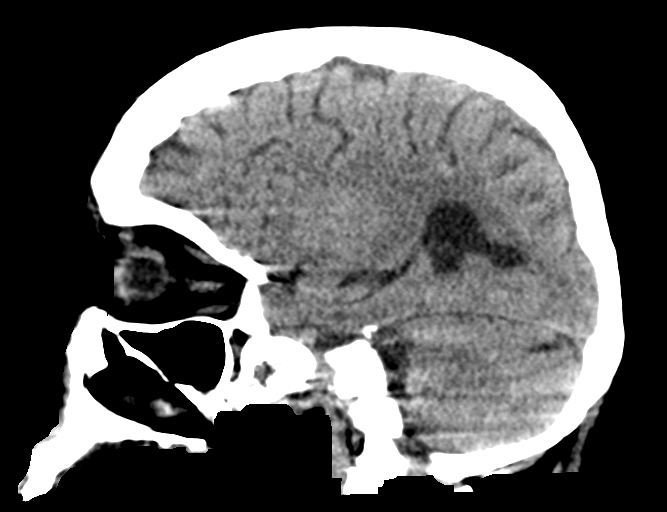

[16 of 46 positions shown; findings below may reference images not displayed]

FINDINGS: Brain: No evidence of acute infarction, hemorrhage, hydrocephalus,
extra-axial collection or mass lesion/mass effect.

Vascular: No hyperdense vessel or unexpected calcification.

Skull: Intact.  No focal lesion.

Sinuses/Orbits: Status post cataract surgery.  Otherwise negative.

Other: None.
IMPRESSION: Negative head CT.
